# Patient Record
Sex: Male | Born: 1990 | Race: White | Hispanic: No | Marital: Single | Smoking: Never smoker
Health system: Southern US, Community
[De-identification: ages and names within clinical notes are randomized; demographics above are authoritative.]

## PROBLEM LIST (undated history)

## (undated) DIAGNOSIS — I150 Renovascular hypertension: Secondary | ICD-10-CM

## (undated) DIAGNOSIS — Z8249 Family history of ischemic heart disease and other diseases of the circulatory system: Secondary | ICD-10-CM

## (undated) DIAGNOSIS — I773 Arterial fibromuscular dysplasia: Secondary | ICD-10-CM

## (undated) HISTORY — DX: Arterial fibromuscular dysplasia: I77.3

## (undated) HISTORY — DX: Family history of ischemic heart disease and other diseases of the circulatory system: Z82.49

---
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
## 2014-02-11 DIAGNOSIS — I773 Arterial fibromuscular dysplasia: Secondary | ICD-10-CM

## 2014-02-11 HISTORY — DX: Arterial fibromuscular dysplasia: I77.3

## 2014-03-05 ENCOUNTER — Encounter: Payer: Self-pay | Admitting: Cardiology

## 2014-03-05 ENCOUNTER — Ambulatory Visit (INDEPENDENT_AMBULATORY_CARE_PROVIDER_SITE_OTHER): Payer: 59 | Admitting: Cardiology

## 2014-03-05 VITALS — BP 182/90 | HR 70 | Ht 70.0 in | Wt 182.3 lb

## 2014-03-05 DIAGNOSIS — R9431 Abnormal electrocardiogram [ECG] [EKG]: Secondary | ICD-10-CM

## 2014-03-05 DIAGNOSIS — I10 Essential (primary) hypertension: Secondary | ICD-10-CM

## 2014-03-05 MED ORDER — HYDRALAZINE HCL 25 MG PO TABS: 25.0000 mg | ORAL_TABLET | Freq: Two times a day (BID) | ORAL | Status: DC

## 2014-03-05 NOTE — Progress Notes (Signed)
PATIENT: William Gilmore MRN: [id number] DOB: 12/11/1990 PCP: Pearson GrippeKIM, JAMES, MD  Clinic Note: Chief Complaint  Patient presents with  . New Evaluation    no chest pain ,no sob ,no edema, for 2-3 years problem with blood pressure  . Hypertension    stopped  2 weeks ago medication were not helping bloodpressure    HPI: William Gilmore is a 23 y.o. male with a PMH below who presents today for evaluation of difficult control hypertension in a young man. He is the son of one of my long-term patient's, William Gilmore who has multivessel CAD status post CABG. Is also morbidly obese but is not very well losing weight.. Apparently is not a problem her blood pressure last 2 years.  Interval History: William Gilmore presents somewhat frustrated with his lack of blood pressure control from his primary care provider. He has been on several different medications but has not had any good control. He recently just stopped all his medicines because he is starting to have problems with decreased libido/inability to perform with erection. He felt somewhat lethargic and tired oral medications, now feels much better. Apparently this is similar situation occurred with his father during his early years and it very difficult to control blood pressure early age. If any sensation of chest tightness or pressure no headaches or blurred vision her TIA/60. No rapid or irregular heartbeats except for occasional rare palpitations. No lightheadedness or dizziness or wooziness, syncope/near-syncope her TIA/amaurosis fugax symptoms. He does drink a significant caffeine but he knows what he thinks that his blood pressure will go high.   Past Medical History  Diagnosis Date  . Hypertension     Prior Cardiac Evaluation and Past Surgical History: History reviewed. No pertinent past surgical history.  No Known Allergies  Current Outpatient Prescriptions -- as prescribed, but is not taking any of them.   Medication Sig Dispense Refill  .  amLODipine (NORVASC) 5 MG tablet Take 5 mg by mouth daily.      . hydrochlorothiazide (HYDRODIURIL) 25 MG tablet Take 25 mg by mouth daily.      Marland Kitchen. losartan (COZAAR) 100 MG tablet Take 100 mg by mouth daily.       No current facility-administered medications for this visit.    History   Social History Narrative   He is a single man who is in a long-term relationship. 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    He drinks maybe 5 beers a week. He likes to work out doing weightlifting, sprinting and otherwise running. It is about 1 hour weights and 30 minutes with sprinting and the other running laps. He works out about 3-5days week.    family history includes Cancer in his paternal grandmother; Diabetes in his paternal grandfather; Heart disease in his father and maternal grandfather; Hypertension in his father.  ROS: A comprehensive Review of Systems - Negative except As noted in history of present illness.  PHYSICAL EXAM BP 182/90  Pulse 70  Ht 5\' 10"  (1.778 m)  Wt 182 lb 4.8 oz (82.691 kg)  BMI 26.16 kg/m2 General appearance: alert, cooperative, appears stated age, no distress and Well-nourished and well-groomed. Normal mood and affect HEENT: [place]/AT, EOMI, MMM, anicteric sclera Neck: no adenopathy, no carotid bruit, no JVD, supple, symmetrical, trachea midline and thyroid not enlarged, symmetric, no tenderness/mass/nodules Lungs: clear to auscultation bilaterally, normal percussion bilaterally and Nonlabored, good air movement Heart: regular rate and rhythm, S1, S2 normal,  no murmur, click, rub or gallop and normal apical impulse Abdomen: soft, non-tender; bowel sounds normal; no masses,  no organomegaly Extremities: extremities normal, atraumatic, no cyanosis or edema Pulses: 2+ and symmetric Neurologic: Alert and oriented X 3, normal strength and tone. Normal symmetric reflexes. Normal coordination and gait   Adult ECG  Report  Rate: 70 ;  Rhythm: normal sinus rhythm with PACs, short PR interval. High voltage but normal for age; normal EKG  Recent Labs: No labs  ASSESSMENT / PLAN: Very pleasant young man with a family history of CAD and hypertension who now is being troubled with significant hypertension. He is otherwise healthy without major problems. On no medications his blood pressures are in the 180s/90s.  It is actually somewhat to his back he is not currently on any medications because they can inject laughter potential secondary hypertension.  Severe uncontrolled hypertension unsure if essential versus secondary In a young man with no other reasons to have hypertension besides his father's history, warrants evaluation for secondary hypertension.  Plan: Will order Dopplers to evaluate for renovascular hypertension (FMD), 2-D echocardiogram as he does have PAC just impingeds to ensure no early signs of hypertensive heart disease and also for the possibility of coarctation., Will check TSH, urine and plasma metanephrines, CMP as well as renal and aldosterone levels.   For now we'll leave off medications until labs are drawn. I will however start hydralazine 25 twice a day in the interim   Treating his blood pressure will be problematic because the regimen he is currently on has made him feel quite lethargic and decreased libido. Would need to be careful with beta blockers. Hopefully the effect will not be long-term and he'll be able to be happy with a gradual reduction in blood pressure.  Abnormal EKG Probably due to age but, but there is relatively high voltage. 1 to ensure he does not have LVH already.   Orders Placed This Encounter  Procedures  . Aldosterone + renin activity w/ ratio  . Metanephrines, Urine, 24 hour  . Metanephrines, plasma  . COMPLETE METABOLIC PANEL WITH GFR  . TSH  . EKG 12-Lead  . 2D Echocardiogram without contrast    ABN EKG, HYPERTENSION    Standing Status: Future      Number of Occurrences:      Standing Expiration Date: 03/05/2015    Order Specific Question:  Type of Echo    Answer:  Complete    Order Specific Question:  Where should this test be performed    Answer:  [facility]    Order Specific Question:  Reason for exam-Echo    Answer:  Other - See Comments Section    Order Specific Question:  Reason for exam-Echo    Answer:  Other - See Comments Section  . Renal Artery Duplex Bilateral    Standing Status: Future     Number of Occurrences:      Standing Expiration Date: 03/05/2015    Order Specific Question:  Laterality    Answer:  Bilateral    Order Specific Question:  Where should this test be performed:    Answer:  [facility]   Meds ordered this encounter  Medications  . hydrALAZINE (APRESOLINE) 25 MG tablet    Sig: Take 1 tablet (25 mg total) by mouth 2 (two) times daily.    Dispense:  60 tablet    Refill:  6    Followup: 1-3 months  DAVID W. Herbie Baltimore, M.D., M.S. Interventional  Cardiolgy [facility]

## 2014-03-05 NOTE — Patient Instructions (Addendum)
Your physician has requested that you have a renal artery duplex. During this test, an ultrasound is used to evaluate blood flow to the kidneys. Allow one hour for this exam. Do not eat after midnight the day before and avoid carbonated beverages. Take your medications as you usually do.  PLEASE  GET LAB SLIP WHEN YOU GET TEST DONE.  Your physician has requested that you have an echocardiogram. Echocardiography is a painless test that uses sound waves to create images of your heart. It provides your doctor with information about the size and shape of your heart and how well your heart's chambers and valves are working. This procedure takes approximately one hour. There are no restrictions for this procedure.   Your physician wants you to follow-up in  1-3  MONTHS Dr Harding.---30 min appointment. Patient needs appointment before he returns to college. You will receive a reminder letter in the mail two months in advance. If you don't receive a letter, please call our office to schedule the follow-up appointment.

## 2014-03-06 ENCOUNTER — Telehealth (HOSPITAL_COMMUNITY): Payer: Self-pay | Admitting: *Deleted

## 2014-03-07 ENCOUNTER — Encounter: Payer: Self-pay | Admitting: Cardiology

## 2014-03-07 ENCOUNTER — Telehealth (HOSPITAL_COMMUNITY): Payer: Self-pay | Admitting: *Deleted

## 2014-03-07 DIAGNOSIS — R9431 Abnormal electrocardiogram [ECG] [EKG]: Secondary | ICD-10-CM | POA: Insufficient documentation

## 2014-03-07 NOTE — Assessment & Plan Note (Addendum)
In a young man with no other reasons to have hypertension besides his father's history, warrants evaluation for secondary hypertension.  Plan: Will order Dopplers to evaluate for renovascular hypertension (FMD), 2-D echocardiogram as he does have PAC just impingeds to ensure no early signs of hypertensive heart disease and also for the possibility of coarctation., Will check TSH, urine and plasma metanephrines, CMP as well as renal and aldosterone levels.   For now we'll leave off medications until labs are drawn. I will however start hydralazine 25 twice a day in the interim   Treating his blood pressure will be problematic because the regimen he is currently on has made him feel quite lethargic and decreased libido. Would need to be careful with beta blockers. Hopefully the effect will not be long-term and he'll be able to be happy with a gradual reduction in blood pressure.

## 2014-03-07 NOTE — Assessment & Plan Note (Signed)
Probably due to age but, but there is relatively high voltage. 1 to ensure he does not have LVH already.

## 2014-03-08 ENCOUNTER — Telehealth (HOSPITAL_COMMUNITY): Payer: Self-pay | Admitting: *Deleted

## 2014-03-11 ENCOUNTER — Ambulatory Visit (HOSPITAL_BASED_OUTPATIENT_CLINIC_OR_DEPARTMENT_OTHER)
Admission: RE | Admit: 2014-03-11 | Discharge: 2014-03-11 | Disposition: A | Discharge status: Home / Self Care | Payer: 59 | Source: Ambulatory Visit | Attending: Cardiology | Admitting: Cardiology

## 2014-03-11 ENCOUNTER — Ambulatory Visit (HOSPITAL_COMMUNITY)
Admission: RE | Admit: 2014-03-11 | Discharge: 2014-03-11 | Disposition: A | Discharge status: Home / Self Care | Payer: 59 | Source: Ambulatory Visit | Attending: Cardiology | Admitting: Cardiology

## 2014-03-11 DIAGNOSIS — R9431 Abnormal electrocardiogram [ECG] [EKG]: Secondary | ICD-10-CM | POA: Insufficient documentation

## 2014-03-11 DIAGNOSIS — I119 Hypertensive heart disease without heart failure: Secondary | ICD-10-CM

## 2014-03-11 DIAGNOSIS — I10 Essential (primary) hypertension: Secondary | ICD-10-CM | POA: Insufficient documentation

## 2014-03-11 NOTE — Progress Notes (Signed)
2D Echocardiogram Complete.  03/11/2014   Bethany McMahill, RDCS  

## 2014-03-11 NOTE — Progress Notes (Signed)
Renal Duplex Completed. Rita Sturdivant, BS, RDMS, RVT  

## 2014-03-12 ENCOUNTER — Telehealth: Payer: Self-pay | Admitting: *Deleted

## 2014-03-12 LAB — COMPLETE METABOLIC PANEL WITH GFR
ALT: 14 U/L (ref 0–53)
AST: 19 U/L (ref 0–37)
Albumin: 4.9 g/dL (ref 3.5–5.2)
Alkaline Phosphatase: 80 U/L (ref 39–117)
BUN: 14 mg/dL (ref 6–23)
CALCIUM: 9.3 mg/dL (ref 8.4–10.5)
CHLORIDE: 103 meq/L (ref 96–112)
CO2: 28 mEq/L (ref 19–32)
Creat: 1.1 mg/dL (ref 0.50–1.35)
GFR, Est African American: >89 mL/min
GFR, Est Non African American: >89 mL/min
Glucose, Bld: 91 mg/dL (ref 70–99)
Potassium: 4.2 mEq/L (ref 3.5–5.3)
SODIUM: 140 meq/L (ref 135–145)
TOTAL PROTEIN: 7.2 g/dL (ref 6.0–8.3)
Total Bilirubin: 0.9 mg/dL (ref 0.2–1.2)

## 2014-03-12 LAB — TSH: TSH: 1.726 u[IU]/mL (ref 0.350–4.500)

## 2014-03-12 NOTE — Progress Notes (Signed)
Quick Note:  Echo results: Good news: Essentially normal echocardiogram and normal pump function and normal valve function. No signs to suggest heart attack.. EF: 60-65%. No regional wall motion abnormalities.  Marykay LexHARDING,DAVID W, M.D., M.S. Interventional Cardiologist        ______

## 2014-03-12 NOTE — Telephone Encounter (Signed)
CALLED NO ANSWER WILL CALL TOMORROW

## 2014-03-12 NOTE — Progress Notes (Signed)
Quick Note:  Echo results: Good news: Essentially normal echocardiogram and normal pump function and normal valve function. No signs to suggest heart attack.. EF: 55-60%. No regional wall motion abnormalities   ______ 

## 2014-03-12 NOTE — Telephone Encounter (Signed)
Left message to call back , concerning echo results

## 2014-03-12 NOTE — Telephone Encounter (Signed)
Message copied by Tobin ChadMARTIN, SHARON V. on Tue Mar 12, 2014  2:17 PM ------      Message from: Marykay LexHARDING, DAVID W      Created: Tue Mar 12, 2014  6:56 AM       Echo results:      Good news: Essentially normal echocardiogram and normal pump function and normal valve function.  No signs to suggest heart attack..      EF: 55-60%.      No regional wall motion abnormalities             ------

## 2014-03-12 NOTE — Telephone Encounter (Signed)
Returning your call. °

## 2014-03-13 ENCOUNTER — Other Ambulatory Visit: Payer: Self-pay | Admitting: Cardiology

## 2014-03-13 ENCOUNTER — Telehealth: Payer: Self-pay | Admitting: *Deleted

## 2014-03-13 NOTE — Telephone Encounter (Signed)
Message copied by Tobin ChadMARTIN, SHARON V. on Wed Mar 13, 2014  9:11 AM ------      Message from: Marykay LexHARDING, DAVID W      Created: Tue Mar 12, 2014  7:00 PM       The Kidney artery doppler study may be our smoking gun -- there is evidence of possible narrowings in the artery to the Right Kidney that is consistent with a condition called Fibromuscular Dysplasia -- one of the possible causes of Secondary High Blood Pressure.            I will be restarting some blood pressure medications when I see you the patient back.  Will also refer to my partner - Dr. Allyson SabalBerry to see if this warrants doing invasive evaluation with Kidney artery angiography (like a heart catheterization, but focusing on kidney arteries).  Will discuss this in follow-up.            Marykay LexHARDING,DAVID W, MD       ------

## 2014-03-13 NOTE — Telephone Encounter (Signed)
Spoke to patient. Result given . Verbalized understanding Will go into more detail at appointment with Dr Herbie BaltimoreHarding. Release in  My Chart

## 2014-03-13 NOTE — Telephone Encounter (Signed)
Spoke to patient. Result given . Verbalized understanding  

## 2014-03-13 NOTE — Progress Notes (Signed)
I'd be happy to see and evaluate for Renal Artery FMD Theodoro Gristave.  JJB

## 2014-03-14 LAB — METANEPHRINES, PLASMA
Metanephrine, Free: 46 pg/mL (ref <=57)
Normetanephrine, Free: 102 pg/mL (ref <=148)
TOTAL METANEPHRINES-PLASMA: 148 pg/mL (ref <=205)

## 2014-03-15 LAB — ALDOSTERONE + RENIN ACTIVITY W/ RATIO
ALDO / PRA Ratio: 3 Ratio (ref 0.9–28.9)
Aldosterone: 6 ng/dL
PRA LC/MS/MS: 2.01 ng/mL/h (ref 0.25–5.82)

## 2014-03-17 LAB — METANEPHRINES, URINE, 24 HOUR
Metaneph Total, Ur: 303 mcg/24 h (ref 94–604)
Metanephrines, Ur: 141 mcg/24 h (ref 25–222)
Normetanephrine, 24H Ur: 162 mcg/24 h (ref 40–412)

## 2014-03-20 ENCOUNTER — Encounter: Payer: Self-pay | Admitting: Cardiology

## 2014-03-20 ENCOUNTER — Ambulatory Visit (INDEPENDENT_AMBULATORY_CARE_PROVIDER_SITE_OTHER): Payer: 59 | Admitting: Cardiology

## 2014-03-20 VITALS — BP 150/86 | HR 68 | Ht 70.5 in | Wt 181.5 lb

## 2014-03-20 DIAGNOSIS — I7789 Other specified disorders of arteries and arterioles: Secondary | ICD-10-CM

## 2014-03-20 DIAGNOSIS — I773 Arterial fibromuscular dysplasia: Secondary | ICD-10-CM

## 2014-03-20 DIAGNOSIS — I10 Essential (primary) hypertension: Secondary | ICD-10-CM

## 2014-03-20 NOTE — Patient Instructions (Signed)
You have been referred to see Dr Allyson SabalBerry - discuss FMD-  APPT.  Apr 15 2014 2:45 PM  After you take the MCAT , stop the hydralazine and restart Amlodopine  Have blood check ; call with results  2-3 months  Your physician wants you to follow-up in  DEC 2015. You will receive a reminder letter in the mail two months in advance. If you don't receive a letter, please call our office to schedule the follow-up appointment.

## 2014-03-21 ENCOUNTER — Encounter: Payer: Self-pay | Admitting: Cardiology

## 2014-03-21 NOTE — Progress Notes (Signed)
PATIENT: William Gilmore MRN: [id number] DOB: 12/16/1990 PCP: Pearson GrippeKIM, JAMES, MD  Clinic Note: Chief Complaint  Patient presents with  . Follow-up    RENAL DOPPLER, LABS, ECHO    HPI: William ProSteven Barradas is a 23 y.o. male with a PMH below who presents today for followup evaluation of difficult control hypertension in a young man. He is the son of one of my long-term patient's, Gerlene BurdockRichard who has multivessel CAD status post CABG. I saw Jeannett SeniorStephen on June 25 for relatively asymptomatic hypertension but which is very difficult control. He was most noted in intolerance to high dose of medications for blood pressure control and was feeling confused and frustrated with lack of blood pressure control despite multiple medications. I took him off of the walker and ARB that he was on as well as the HCTZ at multiple labs including serum and urine metanephrines, thyroid studies, basic chemistries, renin/angiotensin levels. All of these laboratories were normal. I also order renal artery Dopplers to evaluate for possible renal artery stenosis which revealed possible fibromuscular dysplasia in the Right Renal Artery.  I placed him on low-dose hydralazine while waiting for lab work to be done, this has kept his blood pressure mostly in the 150-180 mmHg range.  Interval History: He does feel mostly better while not being on high dose of medications. Less problems with libido and fatigue. He still denies any symptoms of hypertension including blurred vision, headaches, TIA/amaurosis fugax symptoms. No seizure-like activity. No syncope or near-syncope. No chest tightness or pressure with rest or exertion. No exertional dyspnea. No PND, orthopnea or edema. No rapid or irregular heartbeat/palpitations or arrhythmias.  Past Medical History  Diagnosis Date  . Hypertension   . Renal fibromuscular dysplasia 02/2014    RA Dopplers to evaluate premature HTN: Right mid renal artery: Increased velocities with turbulent flow consistent  with fibromuscular dysplasia. Left renal artery normal bilateral kidneys of normal size shape and contour. Celiac Artery and SMA widely patent.  . Family history of premature CAD     Father: Severe MV CAD --> CABG    Prior Cardiac Evaluation and Past Surgical History: History reviewed. No pertinent past surgical history.  No Known Allergies  Previous Outpatient Prescriptions -- as prescribed, but is not taking any of them.  Currently taking hydralazine 25 mg twice a day until converting back to amlodipine and eventually ARB   Medication Sig Dispense Refill  . amLODipine (NORVASC) 5 MG tablet Take 5 mg by mouth daily.      . hydrochlorothiazide (HYDRODIURIL) 25 MG tablet Take 25 mg by mouth daily.      Marland Kitchen. losartan (COZAAR) 100 MG tablet Take 100 mg by mouth daily.       No current facility-administered medications for this visit.    History   Social History Narrative   He is a single man who is in a long-term relationship. 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    He drinks maybe 5 beers a week. He likes to work out doing weightlifting, sprinting and otherwise running. It is about 1 hour weights and 30 minutes with sprinting and the other running laps. He works out about 3-5days week.    family history includes Cancer in his paternal grandmother; Diabetes in his paternal grandfather; Heart disease in his father and maternal grandfather; Hypertension in his father.  ROS: A comprehensive Review of Systems - Negative except As noted in history of present illness. no  melena, hematochezia or hematuria. No epistaxis. No blurred vision  PHYSICAL EXAM BP 150/86  Pulse 68  Ht 5' 10.5" (1.791 m)  Wt 181 lb 8 oz (82.328 kg)  BMI 25.67 kg/m2 General appearance: alert, cooperative, appears stated age, no distress and Well-nourished and well-groomed. Normal mood and affect HEENT: [place]/AT, EOMI, MMM, anicteric sclera Neck: no adenopathy,  no carotid bruit, no JVD, supple, symmetrical, trachea midline and thyroid not enlarged, symmetric, no tenderness/mass/nodules Lungs: clear to auscultation bilaterally, normal percussion bilaterally and Nonlabored, good air movement Heart: regular rate and rhythm, S1, S2 normal, no murmur, click, rub or gallop and normal apical impulse Abdomen: soft, non-tender; bowel sounds normal; no masses,  no organomegaly Extremities: extremities normal, atraumatic, no cyanosis or edema Pulses: 2+ and symmetric Neurologic: Alert and oriented X 3, normal strength and tone. Normal symmetric reflexes. Normal coordination and gait  Recent Labs: Reviewed in Epic. Mostly normal  ASSESSMENT / PLAN: Very pleasant young man with early onset hypertension that is most likely secondary to space on his age and otherwise normal health. Most lab work was relatively normal however his renal artery Dopplers suggested fibromuscular dysplasia of the right renal artery. We're in a tiny collection because he is going back to school in a couple weeks. He then will be back for a couple weeks before starting the entire semester. He would like to have something done prior to this his leaving. I think the most prudent course of action is to refer him to Dr. Nanetta Batty to determine if there is a potential place for PTA of the right renal artery.  For now I meant to increase his medical regimen by restarting the calcium channel blocker at 5 mg daily with plans to increase to 10 mg. Intention would be to have a gradual reduction in blood pressure to avoid the side effects of being on multiple medications. For the reasons of his fatigue and decreased libido, I would not want to use beta blockers. Exam did not have much symptoms with hydralazine which leaves the possibility albeit not as beneficial as an ARB or ACE inhibitor. With the concern of possible renovascular hypertension and the possibility of PTA renal artery I will avoid ARB for  now.  I will see him back in his winter break if not earlier. I recommended that he has his blood pressure evaluated at student health at [facility]. I think it's probably best for him to continue with his current medical therapy until he takes the MCAT test to avoid changing his mental state prior to taking this important test.    Renal fibromuscular dysplasia - possible renovascular hypertension Referred to Dr. Nanetta Batty as noted above for evaluation of possible PTA of renal artery  Severe uncontrolled hypertension unsure if essential versus secondary All the labs looked normal. Evaluating for renal vascular hypertension. Will restart amlodipine after taking the MCAT, and then gradually add back ARB once we know what is going to done with potential PTA renal artery. Plan gradual titration of antihypertensive regimen.    No orders of the defined types were placed in this encounter.   Close to 30 minutes was spent discussing the results of the study and potential treatment options including renal artery PTA. There've any questions about this procedure with details discussed. We also discussed the plan for gradual treatment. Part of the time was also involved with reassurance.  Followup: During December break; scheduled to see Dr. Allyson Sabal on August 3 at 2:45 PM  DAVID W.  Herbie Baltimore, M.D., M.S. Interventional Cardiolgy [facility]

## 2014-03-21 NOTE — Assessment & Plan Note (Signed)
Referred to Dr. Nanetta BattyJonathan Berry as noted above for evaluation of possible PTA of renal artery

## 2014-03-21 NOTE — Assessment & Plan Note (Signed)
All the labs looked normal. Evaluating for renal vascular hypertension. Will restart amlodipine after taking the MCAT, and then gradually add back ARB once we know what is going to done with potential PTA renal artery. Plan gradual titration of antihypertensive regimen.

## 2014-03-25 ENCOUNTER — Ambulatory Visit: Payer: 59 | Admitting: Cardiology

## 2014-03-25 NOTE — Progress Notes (Signed)
I'd be happy to see him William Gilmore. The right renal may indeed have FMD  JJB

## 2014-04-15 ENCOUNTER — Encounter: Payer: Self-pay | Admitting: Cardiovascular Disease

## 2014-04-15 ENCOUNTER — Ambulatory Visit (INDEPENDENT_AMBULATORY_CARE_PROVIDER_SITE_OTHER): Payer: 59 | Admitting: Cardiovascular Disease

## 2014-04-15 VITALS — BP 168/101 | HR 71 | Ht 71.0 in | Wt 186.0 lb

## 2014-04-15 DIAGNOSIS — I7789 Other specified disorders of arteries and arterioles: Secondary | ICD-10-CM

## 2014-04-15 DIAGNOSIS — R0989 Other specified symptoms and signs involving the circulatory and respiratory systems: Secondary | ICD-10-CM

## 2014-04-15 DIAGNOSIS — I773 Arterial fibromuscular dysplasia: Secondary | ICD-10-CM

## 2014-04-15 NOTE — Progress Notes (Signed)
04/15/2014 William Gilmore   [birth year]  [id number]  Primary Physician Pearson Grippe, MD Primary Cardiologist: Runell Gess MD Roseanne Reno   HPI:  William Gilmore is a delightful 23 year old fit-appearing single Caucasian male patient of Dr. Alexis Goodell was referred to me because of renal vascular hypertension. He's been treated medically for several years. Recently his blood pressure has been more difficult to control. 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 Otherwise his past medical history is benign. Does have a family history of premature coronary artery disease.   Current Outpatient Prescriptions  Medication Sig Dispense Refill  . amLODipine (NORVASC) 5 MG tablet Take 5 mg by mouth daily.       No current facility-administered medications for this visit.    No Known Allergies  History   Social History  . Marital Status: Unknown    Spouse Name: N/A    Number of Children: N/A  . Years of Education: N/A   Occupational History  . Not on file.   Social History Main Topics  . Smoking status: Never Smoker   . Smokeless tobacco: Not on file  . Alcohol Use: 3.0 oz/week    5 Cans of beer per week  . Drug Use: No  . Sexual Activity: Not on file   Other Topics Concern  . Not on file   Social History Narrative   He is a single man who is in a long-term relationship. 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    He drinks maybe 5 beers a week. He likes to work out doing weightlifting, sprinting and otherwise running. It is about 1 hour weights and 30 minutes with sprinting and the other running laps. He works out about 3-5days week.     Review of Systems: General: negative for chills, fever, night sweats or weight changes.  Cardiovascular: negative for chest pain, dyspnea on exertion, edema, orthopnea, palpitations, paroxysmal nocturnal dyspnea or shortness of  breath Dermatological: negative for rash Respiratory: negative for cough or wheezing Urologic: negative for hematuria Abdominal: negative for nausea, vomiting, diarrhea, bright red blood per rectum, melena, or hematemesis Neurologic: negative for visual changes, syncope, or dizziness All other systems reviewed and are otherwise negative except as noted above.    Blood pressure 168/101, pulse 71, height 5\' 11"  (1.803 m), weight 186 lb (84.369 kg).  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 Herbie Baltimore for evaluation and treatment of presumed renal vascular hypertension secondary to fibromuscular dysplasia. He has a long history of hypertension dating back years but this has been more noticeable over the last several years and she's been in college. His tone multiple antihypertensive medications who was poor results. Recent renal Doppler studies performed in our office 03/11/14 suggested right renal artery FMD. He is on amlodipine with poorly controlled blood pressures. I'm going to get a CT and exam of his renal arteries to confirm the finding and if indeed he does have renal artery FMD will arrange for him to undergo anoplasty at his  earliest convenience. I have thoroughly discussed the risks and benefits.      Runell GessJonathan J. Waniya Hoglund MD FACP,FACC,FAHA, [facility] 04/15/2014 3:22 PM

## 2014-04-15 NOTE — Patient Instructions (Signed)
  We will see you back in follow up in December 2015 as a peripheral angiogram workup  Dr Allyson SabalBerry has ordered: 1. CT angio of your abdomen and pelvis to look at the right renal artery.   2. Carotid Duplex- This test is an ultrasound of the carotid arteries in your neck. It looks at blood flow through these arteries that supply the brain with blood. Allow one hour for this exam. There are no restrictions or special instructions.  3. Renal Angiogram

## 2014-04-15 NOTE — Assessment & Plan Note (Signed)
The patient was referred to me by Dr. Herbie BaltimoreHarding for evaluation and treatment of presumed renal vascular hypertension secondary to fibromuscular dysplasia. He has a long history of hypertension dating back years but this has been more noticeable over the last several years and she's been in college. His tone multiple antihypertensive medications who was poor results. Recent renal Doppler studies performed in our office 03/11/14 suggested right renal artery FMD. He is on amlodipine with poorly controlled blood pressures. I'm going to get a CT and exam of his renal arteries to confirm the finding and if indeed he does have renal artery FMD will arrange for him to undergo anoplasty at his earliest convenience. I have thoroughly discussed the risks and benefits.

## 2014-04-17 ENCOUNTER — Ambulatory Visit (INDEPENDENT_AMBULATORY_CARE_PROVIDER_SITE_OTHER)
Admission: RE | Admit: 2014-04-17 | Discharge: 2014-04-17 | Disposition: A | Discharge status: Home / Self Care | Payer: 59 | Source: Ambulatory Visit | Attending: Cardiovascular Disease | Admitting: Cardiovascular Disease

## 2014-04-17 DIAGNOSIS — I773 Arterial fibromuscular dysplasia: Secondary | ICD-10-CM

## 2014-04-17 DIAGNOSIS — I7789 Other specified disorders of arteries and arterioles: Secondary | ICD-10-CM

## 2014-04-17 MED ORDER — IOHEXOL 350 MG/ML SOLN: 100.0000 mL | Freq: Once | INTRAVENOUS | Status: AC | PRN

## 2014-04-22 ENCOUNTER — Ambulatory Visit (HOSPITAL_COMMUNITY)
Admission: RE | Admit: 2014-04-22 | Discharge: 2014-04-22 | Disposition: A | Discharge status: Home / Self Care | Payer: 59 | Source: Ambulatory Visit | Attending: Cardiovascular Disease | Admitting: Cardiovascular Disease

## 2014-04-22 DIAGNOSIS — R0989 Other specified symptoms and signs involving the circulatory and respiratory systems: Secondary | ICD-10-CM | POA: Diagnosis not present

## 2014-04-22 NOTE — Progress Notes (Signed)
Carotid Duplex Completed. William Gilmore, BS, RDMS, RVT  

## 2014-08-27 ENCOUNTER — Ambulatory Visit
Admission: RE | Admit: 2014-08-27 | Discharge: 2014-08-27 | Disposition: A | Discharge status: Home / Self Care | Payer: 59 | Source: Ambulatory Visit | Attending: Cardiovascular Disease | Admitting: Cardiovascular Disease

## 2014-08-27 ENCOUNTER — Ambulatory Visit (INDEPENDENT_AMBULATORY_CARE_PROVIDER_SITE_OTHER): Payer: 59 | Admitting: Cardiovascular Disease

## 2014-08-27 ENCOUNTER — Encounter: Payer: Self-pay | Admitting: Cardiovascular Disease

## 2014-08-27 VITALS — BP 154/100 | HR 77 | Ht 70.0 in | Wt 171.9 lb

## 2014-08-27 DIAGNOSIS — I773 Arterial fibromuscular dysplasia: Secondary | ICD-10-CM

## 2014-08-27 DIAGNOSIS — I129 Hypertensive chronic kidney disease with stage 1 through stage 4 chronic kidney disease, or unspecified chronic kidney disease: Secondary | ICD-10-CM

## 2014-08-27 DIAGNOSIS — D689 Coagulation defect, unspecified: Secondary | ICD-10-CM

## 2014-08-27 DIAGNOSIS — Z01818 Encounter for other preprocedural examination: Secondary | ICD-10-CM

## 2014-08-27 DIAGNOSIS — I7789 Other specified disorders of arteries and arterioles: Secondary | ICD-10-CM

## 2014-08-27 LAB — CBC
HCT: 48.4 % (ref 39.0–52.0)
Hemoglobin: 16.1 g/dL (ref 13.0–17.0)
MCH: 28.8 pg (ref 26.0–34.0)
MCHC: 33.3 g/dL (ref 30.0–36.0)
MCV: 86.4 fL (ref 78.0–100.0)
MPV: 10.9 fL (ref 9.4–12.4)
Platelets: 217 10*3/uL (ref 150–400)
RBC: 5.6 MIL/uL (ref 4.22–5.81)
RDW: 13.4 % (ref 11.5–15.5)
WBC: 7.4 10*3/uL (ref 4.0–10.5)

## 2014-08-27 LAB — BASIC METABOLIC PANEL
BUN: 19 mg/dL (ref 6–23)
CHLORIDE: 98 meq/L (ref 96–112)
CO2: 30 meq/L (ref 19–32)
CREATININE: 1.12 mg/dL (ref 0.50–1.35)
Calcium: 9.9 mg/dL (ref 8.4–10.5)
Glucose, Bld: 83 mg/dL (ref 70–99)
POTASSIUM: 4 meq/L (ref 3.5–5.3)
Sodium: 138 mEq/L (ref 135–145)

## 2014-08-27 LAB — APTT: APTT: 28 s (ref 24–37)

## 2014-08-27 LAB — PROTIME-INR
INR: 1.1 (ref <1.50)
Prothrombin Time: 14.2 seconds (ref 11.6–15.2)

## 2014-08-27 NOTE — Progress Notes (Signed)
William Gilmore is a delightful 23-year-old fit-appearing single Caucasian male patient of Dr. David Hardings was referred to me because of renal vascular hypertension. I last saw him in the office 04/15/14. His recovery by his father today. He is currently applying for medical school.He's been treated medically for several years. Recently his blood pressure has been more difficult to control. 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 Otherwise his past medical history is benign. Does have a family history of premature coronary artery disease. CT angiography performed 04/17/14 suggested fibromuscular dysplasia of the right renal artery. 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 I have thoroughly discussed the risks and benefits with the patient and his father who agree to proceed. We'll schedule this sometime next week.   William Gilmore J. Laekyn Rayos, M.D., FACP, FACC, FAHA, FSCAI [facility] [address] [place], [place]  [address]  [phone] 08/27/2014 10:00 AM   

## 2014-08-27 NOTE — Assessment & Plan Note (Signed)
William CanalesSteve returns today for follow-up. His blood pressures have remained elevated. He stopped taking his amlodipine because apparently this has not improved his blood pressure control. His CT angiogram performed on 04/17/14 did suggest mid right renal artery F M.D. Consistent with his renal Doppler studies that were performed on 03/11/14. We have discussed options including balloon angioplasty which the patient has decided to pursue. I feel we discussed the risks and benefits with the patient and his father who was present during the interview.

## 2014-08-27 NOTE — Patient Instructions (Signed)
Dr. Allyson SabalBerry has ordered a Peripheral Angiogram (Renal Angiogram, Right groin) to [facility].  This procedure is going to look at the bloodflow in your lower extremities.  If Dr. Allyson SabalBerry is able to open up the arteries, you will have to spend one night in the hospital.  If he is not able to open the arteries, you will be able to go home that same day.    After the procedure, you will not be allowed to drive for 3 days or push, pull, or lift anything greater than 10 lbs for one week.    You will be required to have the following tests prior to the procedure:  1. Blood work-the blood work can be done no more than 7 days prior to the procedure.  [facility] can be done at any [facility].  There is one downstairs on the first floor of this building and one in [facility] ([address])  2. Chest Xray-the chest xray order has already been placed at [facility].

## 2014-08-28 ENCOUNTER — Encounter (HOSPITAL_COMMUNITY): Payer: Self-pay | Admitting: Pharmacy Technician

## 2014-08-28 ENCOUNTER — Other Ambulatory Visit: Payer: Self-pay | Admitting: *Deleted

## 2014-08-28 DIAGNOSIS — I129 Hypertensive chronic kidney disease with stage 1 through stage 4 chronic kidney disease, or unspecified chronic kidney disease: Secondary | ICD-10-CM

## 2014-08-28 NOTE — Progress Notes (Signed)
Hospital orders for procedure entered

## 2014-09-02 ENCOUNTER — Encounter (HOSPITAL_COMMUNITY)
Admission: RE | Disposition: A | Discharge status: Home / Self Care | Payer: Self-pay | Source: Ambulatory Visit | Attending: Cardiovascular Disease

## 2014-09-02 ENCOUNTER — Ambulatory Visit (HOSPITAL_COMMUNITY)
Admission: RE | Admit: 2014-09-02 | Discharge: 2014-09-03 | Disposition: A | Discharge status: Home / Self Care | Payer: 59 | Source: Ambulatory Visit | Attending: Cardiovascular Disease | Admitting: Cardiovascular Disease

## 2014-09-02 ENCOUNTER — Encounter (HOSPITAL_COMMUNITY): Payer: Self-pay | Admitting: General Practice

## 2014-09-02 DIAGNOSIS — Z8249 Family history of ischemic heart disease and other diseases of the circulatory system: Secondary | ICD-10-CM | POA: Insufficient documentation

## 2014-09-02 DIAGNOSIS — I150 Renovascular hypertension: Secondary | ICD-10-CM | POA: Diagnosis present

## 2014-09-02 DIAGNOSIS — I773 Arterial fibromuscular dysplasia: Secondary | ICD-10-CM | POA: Diagnosis not present

## 2014-09-02 DIAGNOSIS — I129 Hypertensive chronic kidney disease with stage 1 through stage 4 chronic kidney disease, or unspecified chronic kidney disease: Secondary | ICD-10-CM

## 2014-09-02 DIAGNOSIS — I701 Atherosclerosis of renal artery: Secondary | ICD-10-CM | POA: Insufficient documentation

## 2014-09-02 HISTORY — PX: RENAL ANGIOGRAM: SHX5509

## 2014-09-02 HISTORY — PX: RENAL ARTERY ANGIOPLASTY: SHX2317

## 2014-09-02 HISTORY — PX: CARDIAC CATHETERIZATION: SHX172

## 2014-09-02 HISTORY — DX: Renovascular hypertension: I15.0

## 2014-09-02 LAB — POCT ACTIVATED CLOTTING TIME: ACTIVATED CLOTTING TIME: 171 s

## 2014-09-02 SURGERY — RENAL ANGIOGRAM
Anesthesia: LOCAL | Laterality: Right

## 2014-09-02 MED ORDER — LIDOCAINE HCL (PF) 1 % IJ SOLN
INTRAMUSCULAR | Status: AC
  Filled 2014-09-02: qty 30

## 2014-09-02 MED ORDER — ONDANSETRON HCL 4 MG/2ML IJ SOLN
INTRAMUSCULAR | Status: AC
  Administered 2014-09-02: 4 mg via INTRAVENOUS
  Filled 2014-09-02: qty 2

## 2014-09-02 MED ORDER — MIDAZOLAM HCL 2 MG/2ML IJ SOLN
INTRAMUSCULAR | Status: AC
  Filled 2014-09-02: qty 2

## 2014-09-02 MED ORDER — HEPARIN (PORCINE) IN NACL 2-0.9 UNIT/ML-% IJ SOLN
INTRAMUSCULAR | Status: AC
  Filled 2014-09-02: qty 500

## 2014-09-02 MED ORDER — ATROPINE SULFATE 0.1 MG/ML IJ SOLN
INTRAMUSCULAR | Status: AC
  Filled 2014-09-02: qty 10

## 2014-09-02 MED ORDER — MORPHINE SULFATE 2 MG/ML IJ SOLN: 2.0000 mg | INTRAMUSCULAR | Status: DC | PRN

## 2014-09-02 MED ORDER — ONDANSETRON HCL 4 MG/2ML IJ SOLN
4.0000 mg | Freq: Four times a day (QID) | INTRAMUSCULAR | Status: DC | PRN
  Administered 2014-09-02: 4 mg via INTRAVENOUS

## 2014-09-02 MED ORDER — ACETAMINOPHEN 325 MG PO TABS: 650.0000 mg | ORAL_TABLET | ORAL | Status: DC | PRN

## 2014-09-02 MED ORDER — ASPIRIN 81 MG PO CHEW
81.0000 mg | CHEWABLE_TABLET | ORAL | Status: AC
  Administered 2014-09-02: 81 mg via ORAL

## 2014-09-02 MED ORDER — HEPARIN SODIUM (PORCINE) 1000 UNIT/ML IJ SOLN
INTRAMUSCULAR | Status: AC
  Filled 2014-09-02: qty 1

## 2014-09-02 MED ORDER — ASPIRIN EC 325 MG PO TBEC
325.0000 mg | DELAYED_RELEASE_TABLET | Freq: Every day | ORAL | Status: DC
  Administered 2014-09-03: 09:00:00 325 mg via ORAL
  Filled 2014-09-02: qty 1

## 2014-09-02 MED ORDER — ASPIRIN 81 MG PO CHEW
CHEWABLE_TABLET | ORAL | Status: AC
  Filled 2014-09-02: qty 1

## 2014-09-02 MED ORDER — ONDANSETRON HCL 4 MG/2ML IJ SOLN
INTRAMUSCULAR | Status: AC
  Filled 2014-09-02: qty 2

## 2014-09-02 MED ORDER — INFLUENZA VAC SPLIT QUAD 0.5 ML IM SUSY
0.5000 mL | PREFILLED_SYRINGE | Freq: Once | INTRAMUSCULAR | Status: AC
  Administered 2014-09-03: 10:00:00 0.5 mL via INTRAMUSCULAR
  Filled 2014-09-02: qty 0.5

## 2014-09-02 MED ORDER — SODIUM CHLORIDE 0.9 % IV SOLN
INTRAVENOUS | Status: AC
  Administered 2014-09-02: 19:00:00 via INTRAVENOUS

## 2014-09-02 MED ORDER — AMLODIPINE BESYLATE 5 MG PO TABS
5.0000 mg | ORAL_TABLET | Freq: Every day | ORAL | Status: DC
  Administered 2014-09-02: 19:00:00 5 mg via ORAL
  Filled 2014-09-02 (×3): qty 1

## 2014-09-02 MED ORDER — SODIUM CHLORIDE 0.9 % IJ SOLN: 3.0000 mL | INTRAMUSCULAR | Status: DC | PRN

## 2014-09-02 MED ORDER — SODIUM CHLORIDE 0.9 % IV SOLN
INTRAVENOUS | Status: DC
  Administered 2014-09-02: 06:00:00 via INTRAVENOUS

## 2014-09-02 MED ORDER — FENTANYL CITRATE 0.05 MG/ML IJ SOLN
INTRAMUSCULAR | Status: AC
  Filled 2014-09-02: qty 2

## 2014-09-02 NOTE — H&P (View-Only) (Signed)
William Gilmore is a delightful 23 year old fit-appearing single Caucasian male patient of Dr. Alexis Goodellavid Hardings was referred to me because of renal vascular hypertension. I last saw him in the office 04/15/14. His recovery by his father today. He is currently applying for medical school.He's been treated medically for several years. Recently his blood pressure has been more difficult to control. 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 Otherwise his past medical history is benign. Does have a family history of premature coronary artery disease. CT angiography performed 04/17/14 suggested fibromuscular dysplasia of the right renal artery. 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 I have thoroughly discussed the risks and benefits with the patient and his father who agree to proceed. We'll schedule this sometime next week.   Runell GessJonathan J. Wildon Cuevas, M.D., FACP, [facility]ACC, Earl LagosFAHA, [facility] [address] [place], [place]  [id number]  [phone] 08/27/2014 10:00 AM

## 2014-09-02 NOTE — Interval H&P Note (Signed)
History and Physical Interval Note:  09/02/2014 7:40 AM  William Gilmore  has presented today for surgery, with the diagnosis of renal artery stenosis  The various methods of treatment have been discussed with the patient and family. After consideration of risks, benefits and other options for treatment, the patient has consented to  Procedure(s): RENAL ANGIOGRAM (N/A) as a surgical intervention .  The patient's history has been reviewed, patient examined, no change in status, stable for surgery.  I have reviewed the patient's chart and labs.  Questions were answered to the patient's satisfaction.     Runell GessBERRY,Josemanuel Eakins J

## 2014-09-02 NOTE — Progress Notes (Addendum)
Family in to see.Taking sips of Coke. Denies discomfort. Rt groin level 0.

## 2014-09-02 NOTE — Progress Notes (Signed)
Site area: rt groin Site Prior to Removal:  Level 0 Pressure Applied For: 20 minutes Manual:   yes Patient Status During Pull:  Vageled; nauseated Post Pull Site:  Level 0 Post Pull Instructions Given:  yes Post Pull Pulses Present: yes Dressing Applied:  tegaderm Bedrest begins @ 1010 Comments: nausea gone. VSS.

## 2014-09-02 NOTE — CV Procedure (Signed)
William Gilmore is a 23 y.o. male    [id number] LOCATION:  FACILITY: [facility]  PHYSICIAN: Quay Burow, M.D. 1991-02-11   DATE OF PROCEDURE:  09/02/2014  DATE OF DISCHARGE:     PV Angiogram/Intervention    History obtained from chart review.William Gilmore is a delightful 23 year old fit-appearing single Caucasian male patient of Dr. Liana Crocker was referred to me because of renal vascular hypertension. He is currently applying for medical school.He's been treated medically for several years. Recently his blood pressure has been more difficult to control. 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 Otherwise his past medical history is benign. Does have a family history of premature coronary artery disease. CT angiography performed 04/17/14 suggested fibromuscular dysplasia of the right renal artery. 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 I have thoroughly discussed the risks and benefits with the patient and his father who agree to proceed. William Gilmore presents today for angiography and potential intervention for diagnosis and treatment of presumed renal vascular hypertension secondary to fibromuscular dysplasia.   PROCEDURE DESCRIPTION:   The patient was brought to the second floor [facility] cath lab in the postabsorptive state. He was premedicated with Valium 5 mg by mouth, IV Versed and fentanyl. His right groin was prepped and shaved in usual sterile fashion. Xylocaine 1% was used for local anesthesia. A 6 French sheath was inserted into the right common femoral artery using standard Seldinger technique.a 5 French pigtail catheter was placed at the level of the renal arteries. Abnormal aortography was performed using a pigtail catheter. Visipaque dye was used for  the entirety of the case. Retrograde aortic pressure was monitored during the case. The patient did become vagal when accessing that the right common femoral artery requiring Zofran and 0.5 mg of atropine.  HEMODYNAMICS:    AO SYSTOLIC/AO DIASTOLIC: 607/37 (after he became vagal)   Angiographic Data:   1: Abdominal aortogram-the aorta itself appeared free of atherosclerosis. The left renal artery was normal. The right renal artery had a small septa in its midportion consistent with FMD.  IMPRESSION:abdominal aortogram shows findings consistent with FMD of the right renal artery. We'll proceed with PTA.  Procedure Description:the patient received 9500 units of heparin with an ACT of 202. Total contrast administered to the patient was 140 mL. Using a 6 [place] short JR4 guide catheter along with an 014 stabilizer wire and a 5 mm x 15 mm long balloon initial angioplasty was performed of the area in question/FMD. This appeared to be undersized and I therefore upsized to a 6 mm x 15 mm long balloon which appear to be properly sized. I inflated at 6 atm for 30 seconds. Completion angiography revealed an excellent angiographic result. There is no obvious staining, dissection or perforation. There is excellent flow. The guidewire and catheter were removed and the sheath was secured. The patient left the lab in stable condition.  Final Impression: successful PTA of mid right renal artery FMD in setting of presumed renal vascular hypertension. The sheath will be removed once the ACT falls below 170 impression will be held. The patient will be hydrated overnight and blood work will be obtained the morning. He'll be treated with aspirin. We will get renal Dopplers on him next week and I will see him back the following week prior to him going back to  school. He'll need semiannual renal Doppler studies to evaluate for restenosis.    Lorretta Harp MD, Operating Room Services 09/02/2014 8:55 AM

## 2014-09-03 ENCOUNTER — Other Ambulatory Visit: Payer: Self-pay | Admitting: Physician Assistant

## 2014-09-03 ENCOUNTER — Encounter (HOSPITAL_COMMUNITY): Payer: Self-pay | Admitting: Physician Assistant

## 2014-09-03 DIAGNOSIS — I773 Arterial fibromuscular dysplasia: Secondary | ICD-10-CM

## 2014-09-03 DIAGNOSIS — I701 Atherosclerosis of renal artery: Secondary | ICD-10-CM

## 2014-09-03 DIAGNOSIS — Z8249 Family history of ischemic heart disease and other diseases of the circulatory system: Secondary | ICD-10-CM

## 2014-09-03 DIAGNOSIS — I150 Renovascular hypertension: Secondary | ICD-10-CM | POA: Insufficient documentation

## 2014-09-03 LAB — CBC
HCT: 42.8 % (ref 39.0–52.0)
Hemoglobin: 14.1 g/dL (ref 13.0–17.0)
MCH: 28.8 pg (ref 26.0–34.0)
MCHC: 32.9 g/dL (ref 30.0–36.0)
MCV: 87.5 fL (ref 78.0–100.0)
Platelets: 195 10*3/uL (ref 150–400)
RBC: 4.89 MIL/uL (ref 4.22–5.81)
RDW: 13 % (ref 11.5–15.5)
WBC: 9.1 10*3/uL (ref 4.0–10.5)

## 2014-09-03 LAB — BASIC METABOLIC PANEL
ANION GAP: 4 — AB (ref 5–15)
BUN: 12 mg/dL (ref 6–23)
CALCIUM: 9.1 mg/dL (ref 8.4–10.5)
CO2: 31 mmol/L (ref 19–32)
Chloride: 103 mEq/L (ref 96–112)
Creatinine, Ser: 1.22 mg/dL (ref 0.50–1.35)
GFR calc Af Amer: >90 mL/min (ref >90)
GFR calc non Af Amer: 83 mL/min — ABNORMAL LOW (ref >90)
GLUCOSE: 100 mg/dL — AB (ref 70–99)
Potassium: 4.2 mmol/L (ref 3.5–5.1)
SODIUM: 138 mmol/L (ref 135–145)

## 2014-09-03 LAB — POCT ACTIVATED CLOTTING TIME
ACTIVATED CLOTTING TIME: 214 s
ACTIVATED CLOTTING TIME: 202 s
Activated Clotting Time: 171 seconds

## 2014-09-03 MED ORDER — ASPIRIN 325 MG PO TBEC: 325.0000 mg | DELAYED_RELEASE_TABLET | Freq: Every day | ORAL | Status: AC

## 2014-09-03 NOTE — Discharge Summary (Signed)
Discharge Summary   Patient ID: William Gilmore MRN: [id number], DOB/AGE: [birth year] 23 y.o. Admit date: 09/02/2014 D/C date:     09/03/2014  Primary Cardiologist: Dr. Ellyn Hack and Dr. Gwenlyn Found    Principal Problem:   Renal fibromuscular dysplasia - possible renovascular hypertension Active Problems:   Family history of premature CAD   Admission Dates: 09/02/14-09/03/14 Discharge Diagnosis: Renal fibromuscular dysplasia s/p successful PTA of mid right renal artery   HPI: William Gilmore is a delightful 23 year old fit-appearing single Caucasian male patient of Dr. Liana Crocker was referred to Dr Gwenlyn Found because of renal vascular hypertension. He was seen in the office 04/15/14. He is currently applying for medical school. He's been treated medically for hypertension for several years. Recently his blood pressure has been more difficult to control. 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 Otherwise his past medical history is benign. Does have a family history of premature coronary artery disease. CT angiography performed 04/17/14 suggested fibromuscular dysplasia of the right renal artery. 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   Hospital Course  Renal fibromuscular dysplasia- admitted on 09/02/14 for planned angiography.  -- S/p successful PTA of mid right renal artery  -- BP's much better controlled. 121/64mg HG on last reading. Will discontinue amlodipine  -- Continue ASA  The patient has had an uncomplicated hospital course and is recovering well. The femoral catheter site is stable. He has been seen by Dr. BGwenlyn Foundtoday and deemed ready for discharge home. All follow-up appointments have been scheduled. [facility] and then follow up with a PA on a day Dr. BGwenlyn Foundis in the office before he leaves for college on 09/22/13. Discharge medications are listed below.   Discharge Vitals: Blood pressure 121/52, pulse 60, temperature 97.7 F (36.5 C), temperature source Oral, resp. rate 18, height 5' 10.5" (1.791 m), weight 171 lb 8.3 oz (77.8 kg), SpO2 100 %.  Labs: Lab Results  Component Value Date   WBC 9.1 09/03/2014   HGB 14.1 09/03/2014   HCT 42.8 09/03/2014   MCV 87.5 09/03/2014   PLT 195 09/03/2014     Recent Labs Lab 09/03/14 0405  NA 138  K 4.2  CL 103  CO2 31  BUN 12  CREATININE 1.22  CALCIUM 9.1  GLUCOSE 100*   Diagnostic Studies/Procedures   Dg Chest 2 View  08/27/2014   CLINICAL DATA:  Preoperative evaluation ; history of hypertension; family history of early coronary artery disease  EXAM: CHEST  2 VIEW  COMPARISON:  None.  FINDINGS: The lungs are mildly hyperinflated and clear. The heart and pulmonary vascularity are within the limits of normal. There is no pleural effusion. The mediastinum is normal in width. There is gentle mid thoracic spinal curvature convex towards the right.  IMPRESSION: Mild hyperinflation may be voluntary or could reflect underlying reactive airway disease. There is no acute cardiopulmonary abnormality.   Electronically Signed   By: David  JMartinique  On: 08/27/2014 14:25    PV Angiogram/Intervention  History obtained from chart review.SAnnie Mainis a delightful 23year old fit-appearing single Caucasian male patient of Dr. DLiana Crockerwas referred to me because of renal vascular hypertension. He is currently applying for medical school.He's been treated medically for several years. Recently his blood pressure has  been more difficult to control. 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 Otherwise his past medical history is benign. Does have a family history of premature coronary artery disease. CT angiography performed  04/17/14 suggested fibromuscular dysplasia of the right renal artery. 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 I have thoroughly discussed the risks and benefits with the patient and his father who agree to proceed. William Gilmore presents today for angiography and potential intervention for diagnosis and treatment of presumed renal vascular hypertension secondary to fibromuscular dysplasia.  PROCEDURE DESCRIPTION:  The patient was brought to the second floor [place] [facility]iac cath lab in the postabsorptive state. He was premedicated with Valium 5 mg by mouth, IV Versed and fentanyl. His right groin  was prepped and shaved in usual sterile fashion. Xylocaine 1% was used for local anesthesia. A 6 French sheath was inserted into the right common femoral artery using standard Seldinger technique.a 5 French pigtail catheter was placed at the level of the renal arteries. Abnormal aortography was performed using a pigtail catheter. Visipaque dye was used for the entirety of the case. Retrograde aortic pressure was monitored during the case. The patient did become vagal when accessing that the right common femoral artery requiring Zofran and 0.5 mg of atropine.  HEMODYNAMICS:  AO SYSTOLIC/AO DIASTOLIC: 382/50 (after he became vagal)  Angiographic Data:  1: Abdominal aortogram-the aorta itself appeared free of atherosclerosis. The left renal artery was normal. The right renal artery had a small septa in its midportion consistent with FMD.  IMPRESSION:abdominal aortogram shows findings consistent with FMD of the right renal artery. We'll proceed with PTA.  Procedure Description:the patient received 9500 units of heparin with an ACT of 202. Total contrast administered to the patient was 140 mL. Using a 6 [place] short JR4 guide  catheter along with an 014 stabilizer wire and a 5 mm x 15 mm long balloon initial angioplasty was performed of the area in question/FMD. This appeared to be undersized and I therefore upsized to a 6 mm x 15 mm long balloon which appear to be properly sized. I inflated at 6 atm for 30 seconds. Completion angiography revealed an excellent angiographic result. There is no obvious staining, dissection or perforation. There is excellent flow. The guidewire and catheter were removed and the sheath was secured. The patient left the lab in stable condition.  Final Impression: successful PTA of mid right renal artery FMD in setting of presumed renal vascular hypertension. The sheath will be removed once the ACT falls below 170 impression will be held. The patient will be hydrated overnight and blood work will be obtained the morning. He'll be treated with aspirin. We will get renal Dopplers on him next week and I will see him back the following week prior to him going back to school. He'll need semiannual renal Doppler studies to evaluate for restenosis.       Discharge Medications     Medication List    STOP taking these medications        amLODipine 5 MG tablet  Commonly known as:  NORVASC      TAKE these medications        aspirin 325 MG EC tablet  Take 1 tablet (325 mg total) by mouth daily.        Disposition   The  patient will be discharged in stable condition to home.  Follow-up Information    Follow up with HAGER, BRYAN, PA-C On 09/17/2014.   Specialty:  Physician Assistant   Why:  3:30 pm   Contact information:   [place] [address] [phone]       Follow up with [facility] On 09/10/2014.   Specialty:  Cardiology   Why:  8am. you have to be fasting for this test. Nothing to eat after midnight the night before   Contact information:   [place] [phone]          Duration of Discharge Encounter: Greater than 30 minutes including physician and PA time.  SignedGrandville Silos, KATHRYN R PA-C 09/03/2014, 11:10 AM

## 2014-09-03 NOTE — Progress Notes (Signed)
Right groin level zero ambulated in room no complaints. Patient SB 44-57 with frequent PAC's and  PVC's while sleeping oxygen sats upper 90's RA. While awake NSR heart rate 70-80's.

## 2014-09-03 NOTE — Progress Notes (Signed)
D/c instructions reviewed with pt and family at bedside. Copy of instructions given to pt. Education provided on care of procedure site and restrictions. Pt d/c'd with family with belongings. Pt declined wheelchair, steady gait, ambulated out with family.

## 2014-09-10 ENCOUNTER — Ambulatory Visit (HOSPITAL_COMMUNITY)
Admit: 2014-09-10 | Discharge: 2014-09-10 | Disposition: A | Discharge status: Home / Self Care | Payer: 59 | Attending: Cardiology | Admitting: Cardiology

## 2014-09-10 DIAGNOSIS — I701 Atherosclerosis of renal artery: Secondary | ICD-10-CM | POA: Insufficient documentation

## 2014-09-10 NOTE — Progress Notes (Signed)
Renal Artery Duplex Completed. °Brianna L Mazza,RVT °

## 2014-09-17 ENCOUNTER — Ambulatory Visit: Payer: 59 | Admitting: Physician Assistant

## 2014-09-18 ENCOUNTER — Ambulatory Visit (INDEPENDENT_AMBULATORY_CARE_PROVIDER_SITE_OTHER): Payer: 59 | Admitting: Cardiology

## 2014-09-18 ENCOUNTER — Encounter: Payer: Self-pay | Admitting: Cardiology

## 2014-09-18 VITALS — BP 156/80 | HR 70 | Ht 70.5 in | Wt 171.4 lb

## 2014-09-18 DIAGNOSIS — I150 Renovascular hypertension: Secondary | ICD-10-CM

## 2014-09-18 DIAGNOSIS — I773 Arterial fibromuscular dysplasia: Secondary | ICD-10-CM

## 2014-09-18 DIAGNOSIS — I7789 Other specified disorders of arteries and arterioles: Secondary | ICD-10-CM

## 2014-09-18 DIAGNOSIS — I10 Essential (primary) hypertension: Secondary | ICD-10-CM

## 2014-09-18 LAB — BASIC METABOLIC PANEL
BUN: 25 mg/dL — ABNORMAL HIGH (ref 6–23)
CALCIUM: 9.7 mg/dL (ref 8.4–10.5)
CHLORIDE: 100 meq/L (ref 96–112)
CO2: 27 meq/L (ref 19–32)
CREATININE: 0.92 mg/dL (ref 0.50–1.35)
Glucose, Bld: 85 mg/dL (ref 70–99)
Potassium: 4.3 mEq/L (ref 3.5–5.3)
SODIUM: 138 meq/L (ref 135–145)

## 2014-09-18 MED ORDER — AMLODIPINE BESYLATE 10 MG PO TABS: 10.0000 mg | ORAL_TABLET | Freq: Every day | ORAL | Status: DC

## 2014-09-18 MED ORDER — AMLODIPINE BESYLATE 10 MG PO TABS: 10.0000 mg | ORAL_TABLET | Freq: Every day | ORAL | Status: AC

## 2014-09-18 NOTE — Progress Notes (Signed)
09/18/2014   PCP: Pearson GrippeKIM, JAMES, MD   Chief Complaint  Patient presents with  . Hospitalization Follow-up    pt had angioplasty, no stents was placed    Primary Cardiologist: Dr. Herbie BaltimoreHarding  HPI:  24 year old fit-appearing single Caucasian male patient of Dr. Alexis Goodellavid Hardings was referred to Dr. Erlene QuanJ. Berry  because of renal vascular hypertension. I last saw him in the office 04/15/14.  He is currently applying for medical school.He's been treated medically for several years. Recently his blood pressure has been more difficult to control. 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 Otherwise his past medical history is benign. Does have a family history of premature coronary artery disease. CT angiography performed 04/17/14 suggested fibromuscular dysplasia of the right renal artery. 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   He underwent PTA to Rt renal artery 09/02/14.  This was successful and follow up dopplers with improved velocities.  He is back to see me today post procedure.  He has no complaints, his BP values over last 2 weeks show improved BP though he is on norvasc 5 mg.  He is anxious that he will have increased BP back at school with more caffeine and not resting as well.   No problems with cath site.   No Known Allergies  Current Outpatient Prescriptions  Medication Sig Dispense Refill  . aspirin EC 325 MG EC tablet Take 1 tablet (325 mg total) by mouth daily. 30 tablet 0  . amLODipine (NORVASC) 10 MG tablet Take 1 tablet (10 mg total) by mouth daily. 90 tablet 3   No current facility-administered medications for this visit.    Past Medical History  Diagnosis Date  . Renovascular hypertension   . Renal fibromuscular dysplasia 02/2014    a.  s/p successful PTA of mid right renal artery 09/02/14   . Family history of premature CAD     Father: Severe MV CAD --> CABG    Past Surgical History  Procedure Laterality Date  . Cardiac catheterization  09/02/2014    dr berry  . Renal artery angioplasty  09/02/2014    dr berry  . Renal angiogram N/A 09/02/2014    Procedure: RENAL ANGIOGRAM;  Surgeon: Runell GessJonathan J Berry, MD;  [facility] C[facility];  Service: Cardiovascular;  Laterality: N/A;    XLK:[id number]:OZROS:General:no colds or fevers, no weight changes Skin:no rashes or ulcers HEENT:no blurred vision, no congestion CV:see HPI PUL:see HPI GI:no diarrhea constipation or melena, no indigestion GU:no hematuria, no dysuria MS:no joint pain, no claudication Neuro:no syncope, no lightheadedness Endo:no diabetes, no thyroid disease  Wt Readings from Last 3 Encounters:  09/18/14 171 lb 6.4 oz (77.747 kg)  09/03/14 171 lb 8.3 oz (77.8 kg)  08/27/14 171 lb 14.4 oz (77.973 kg)    PHYSICAL EXAM BP 156/80 mmHg  Pulse 70  Ht 5' 10.5" (1.791 m)  Wt 171 lb 6.4 oz (77.747 kg)  BMI 24.24 kg/m2 General:Pleasant affect, NAD Skin:Warm and dry, brisk capillary refill HEENT:normocephalic, sclera clear, mucus membranes moist Neck:supple, no JVD, soft bruits  Heart:S1S2 RRR without murmur, gallup, rub or click Lungs:clear without rales, rhonchi, or wheezes DGU:YQIHAbd:soft, non tender, + BS, do not palpate liver spleen or masses Ext:no lower ext edema, 2+ pedal pulses, 2+ radial pulses Neuro:alert and oriented,  MAE, follows commands, + facial symmetry EKG:no EKG  ASSESSMENT AND PLAN Renal fibromuscular dysplasia - possible renovascular hypertension Stable, his BP with improved control on amlodipine, though still elevated.  Will increase his amlodipine to 10 mg daily he will call if BP >150/88 or <120/70 and meds will be adjusted.  His renal dopplers were improved.  He will follow up with Dr. Erlene Quan in 6-8 weeks and dopplers every 6 months.  We will check  BMP today.  Renovascular hypertension See above note

## 2014-09-18 NOTE — Assessment & Plan Note (Addendum)
Stable, his BP with improved control on amlodipine, though still elevated.  Will increase his amlodipine to 10 mg daily he will call if BP >150/88 or <120/70 and meds will be adjusted.  His renal dopplers were improved.  He will follow up with Dr. Erlene QuanJ. Berry in 6-8 weeks and dopplers every 6 months.  We will check BMP today.

## 2014-09-18 NOTE — Patient Instructions (Addendum)
Your physician recommends that you schedule a follow-up appointment in: 8-12 Weeks with Dr Allyson SabalBerry  Your physician recommends that you return for lab work [facility]  Your Physician want you to call call our office if Blood Pressure get over 150/88 or below 120/70  Your physician has recommended you make the following change in your medication: Increase Amlodipine to 10 mg daily

## 2014-09-18 NOTE — Assessment & Plan Note (Signed)
See above note

## 2014-11-29 ENCOUNTER — Ambulatory Visit: Payer: Self-pay | Admitting: Cardiovascular Disease

## 2015-03-20 ENCOUNTER — Telehealth (HOSPITAL_COMMUNITY): Payer: Self-pay | Admitting: *Deleted

## 2015-09-13 IMAGING — CT CT CTA ABD/PEL W/CM AND/OR W/O CM
2 of 9 series · 12 of 46 positions shown, 19 images · IV contrast (Omnipaque 300)
Comparison: none

CLINICAL DATA: renal dopplers suggest right renal arterty FMD

EXAM:
CT ANGIOGRAPHY ABDOMEN
TECHNIQUE: Multidetector CT imaging of the abdomen was performed using the
standard protocol during bolus administration of intravenous
contrast. Multiplanar reconstructed images including MIPs were
obtained and reviewed to evaluate the vascular anatomy.
CONTRAST:  100 mL Omnipaque 350 IV

[Series 5: venous 5.0 · axial · portal-venous · 0.67mm/px · z∈[-464,-99]mm · 11 of 89 slices shown, 17 images]
[im 8/89  soft-tissue]
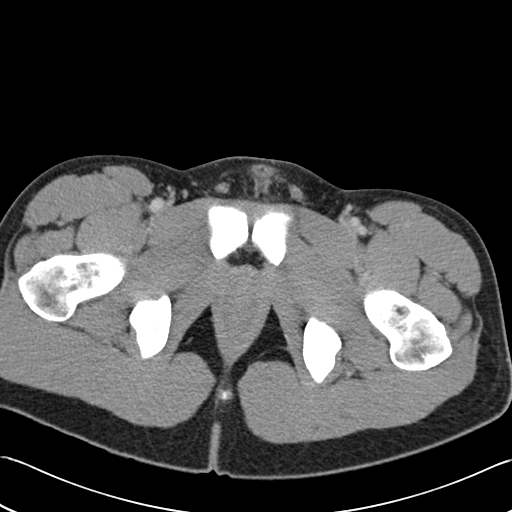
[im 8/89  bone]
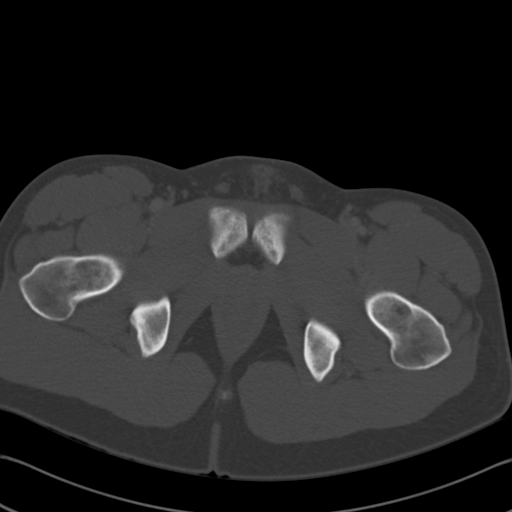
[im 15/89  soft-tissue]
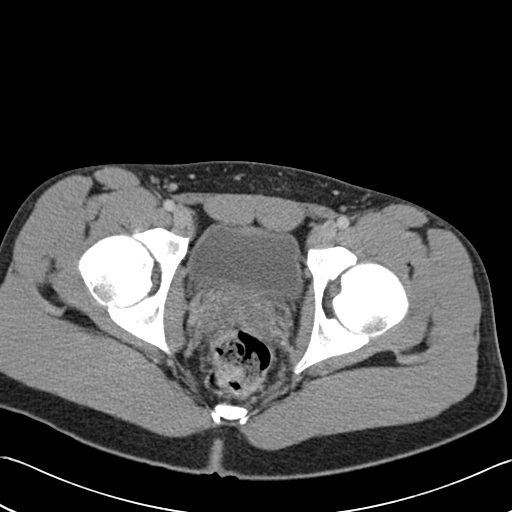
[im 23/89  soft-tissue]
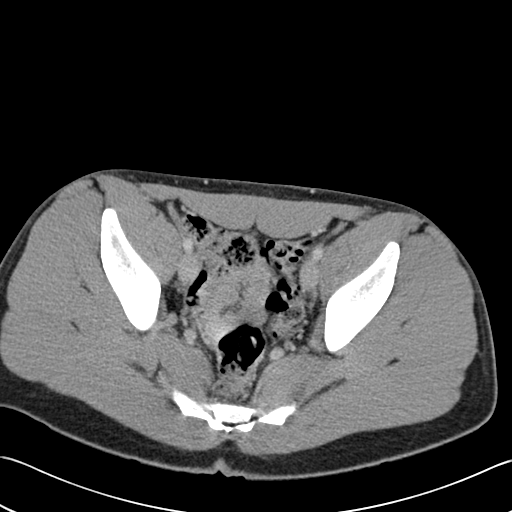
[im 30/89  soft-tissue]
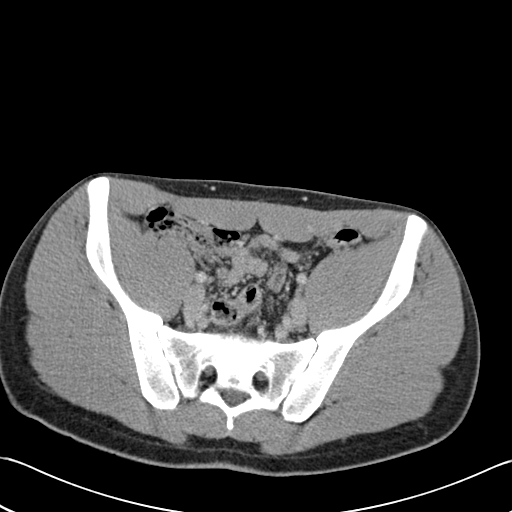
[im 37/89  soft-tissue]
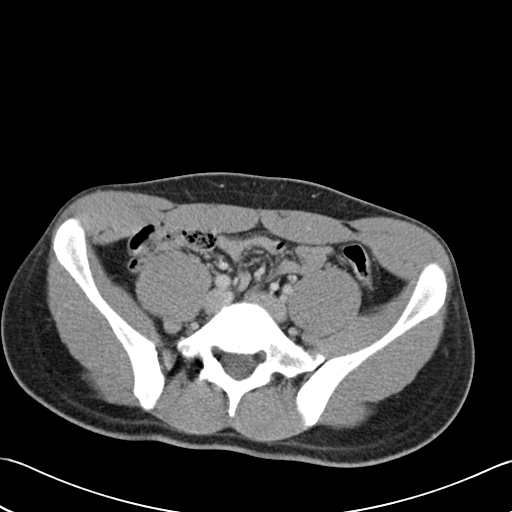
[im 45/89  soft-tissue]
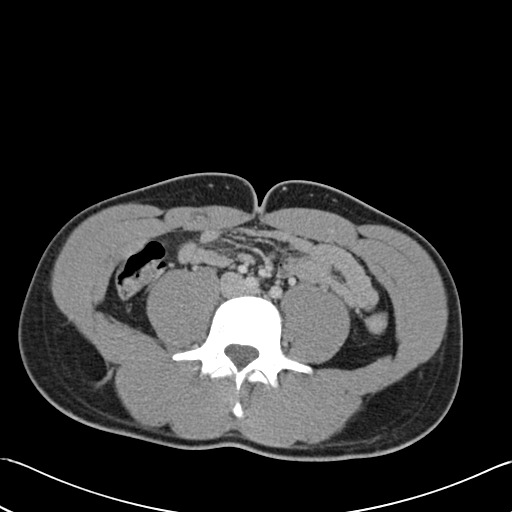
[im 52/89  soft-tissue]
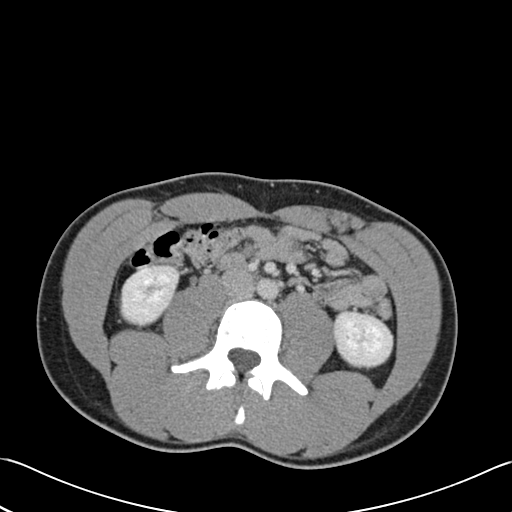
[im 59/89  soft-tissue]
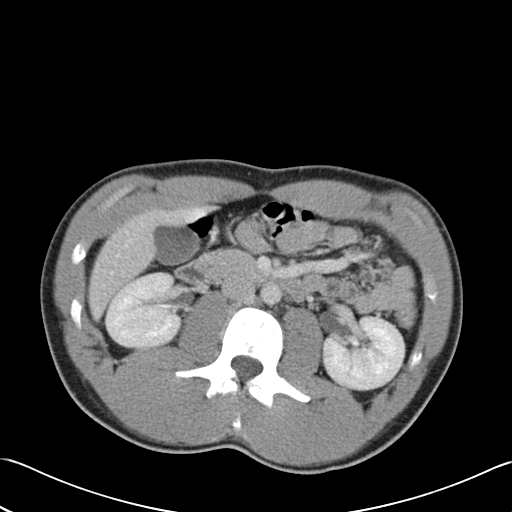
[im 59/89  lung]
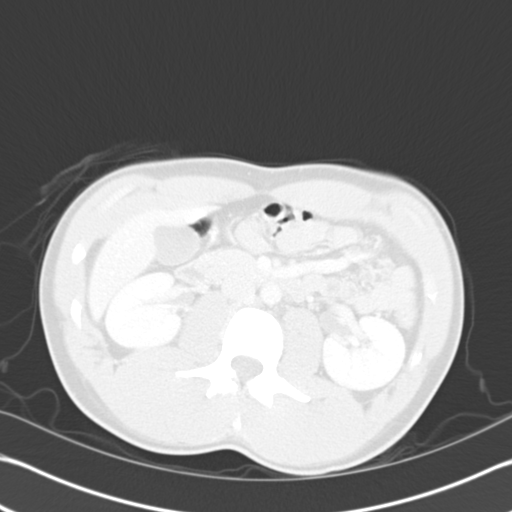
[im 67/89  soft-tissue]
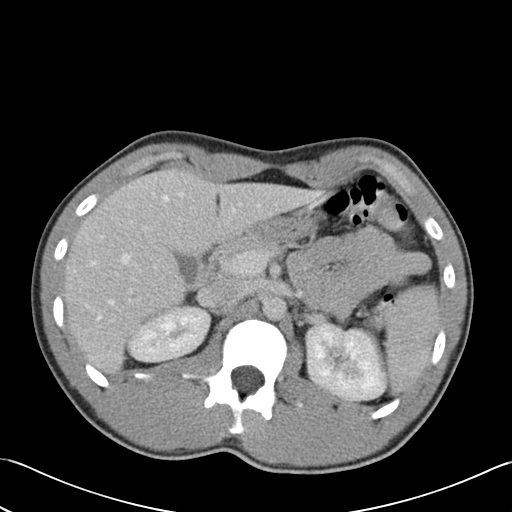
[im 67/89  lung]
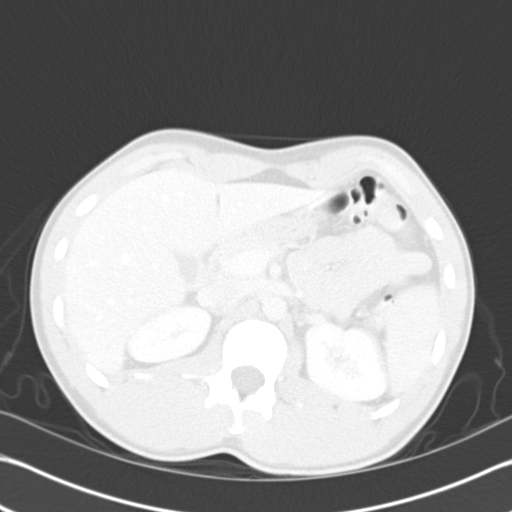
[im 67/89  bone]
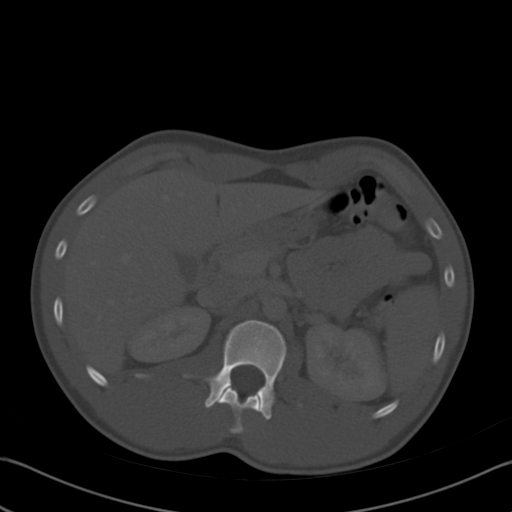
[im 74/89  soft-tissue]
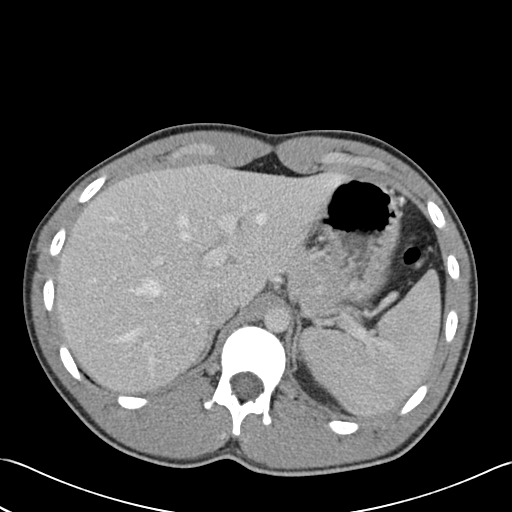
[im 74/89  lung]
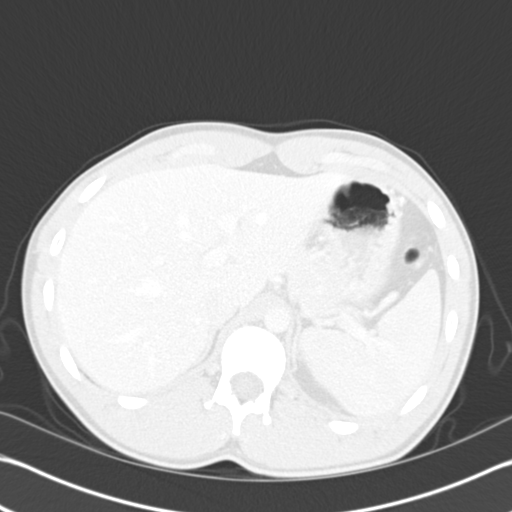
[im 81/89  soft-tissue]
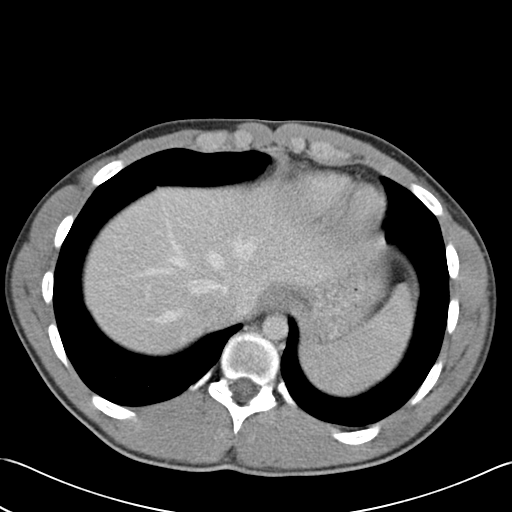
[im 81/89  lung]
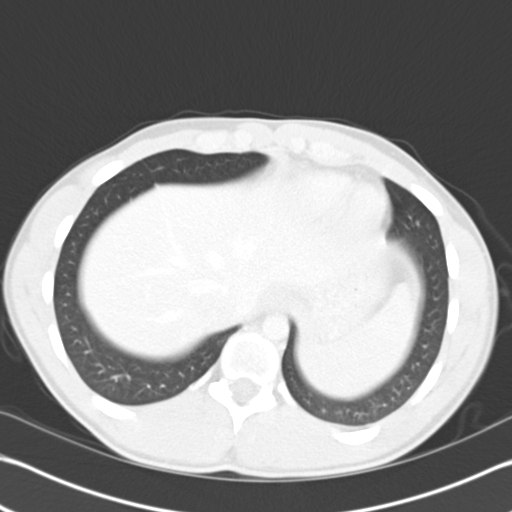

[Series 605: cor mpr · coronal · 0.64mm/px · 1 of 100 slices shown, 2 images]
[im 50/100  soft-tissue]
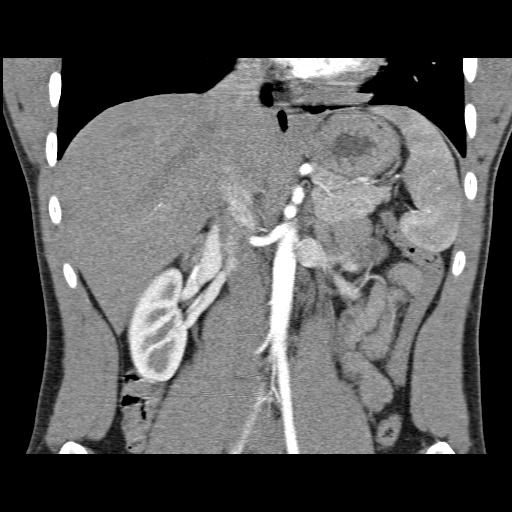
[im 50/100  bone]
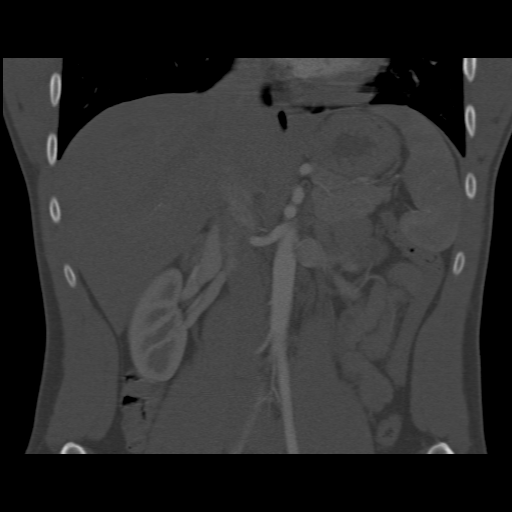

[12 of 46 positions shown; findings below may reference images not displayed]

FINDINGS: ARTERIAL FINDINGS:

Aorta:                Normal

Celiac axis:          Normal

Superior mesenteric:  Normal, classic branch anatomy

Left renal:           Single, normal

Right renal: Single. There is a focal area of mild narrowing in the
mid right renal artery without any definite intraluminal web lumen
or beading. Distal main renal artery and visualized branches
unremarkable.

Inferior mesenteric:  Patent

Iliac arterial system: Unremarkable

Venous findings: Patent hepatic veins, portal vein, superior
mesenteric vein, splenic vein, bilateral renal veins, IVC, and iliac
venous system.

Review of the MIP images confirms the above findings.

Nonvascular findings: Visualized lung bases clear. Unremarkable
liver, nondilated gallbladder, spleen, adrenal glands, kidneys,
pancreas. Normal symmetric bilateral renal parenchymal enhancement.
Stomach, small bowel, and colon are nondilated. Urinary bladder
incompletely distended. No ascites. No free air. No adenopathy
localized.
IMPRESSION: 1. Focal mild narrowing in the mid right renal artery without other
characteristic CT findings of FMD nor convincing evidence of
high-grade stenosis. If clinical suspicion remains high, consider
angiography with pressure measurements.

## 2015-10-29 ENCOUNTER — Other Ambulatory Visit: Payer: Self-pay | Admitting: Cardiology

## 2016-01-23 IMAGING — CR DG CHEST 2V
2 series · 2 of 2 positions shown · non-contrast
Comparison: None.

CLINICAL DATA: Preoperative evaluation ; history of hypertension;
family history of early coronary artery disease

EXAM:
CHEST  2 VIEW

[w chest pa]
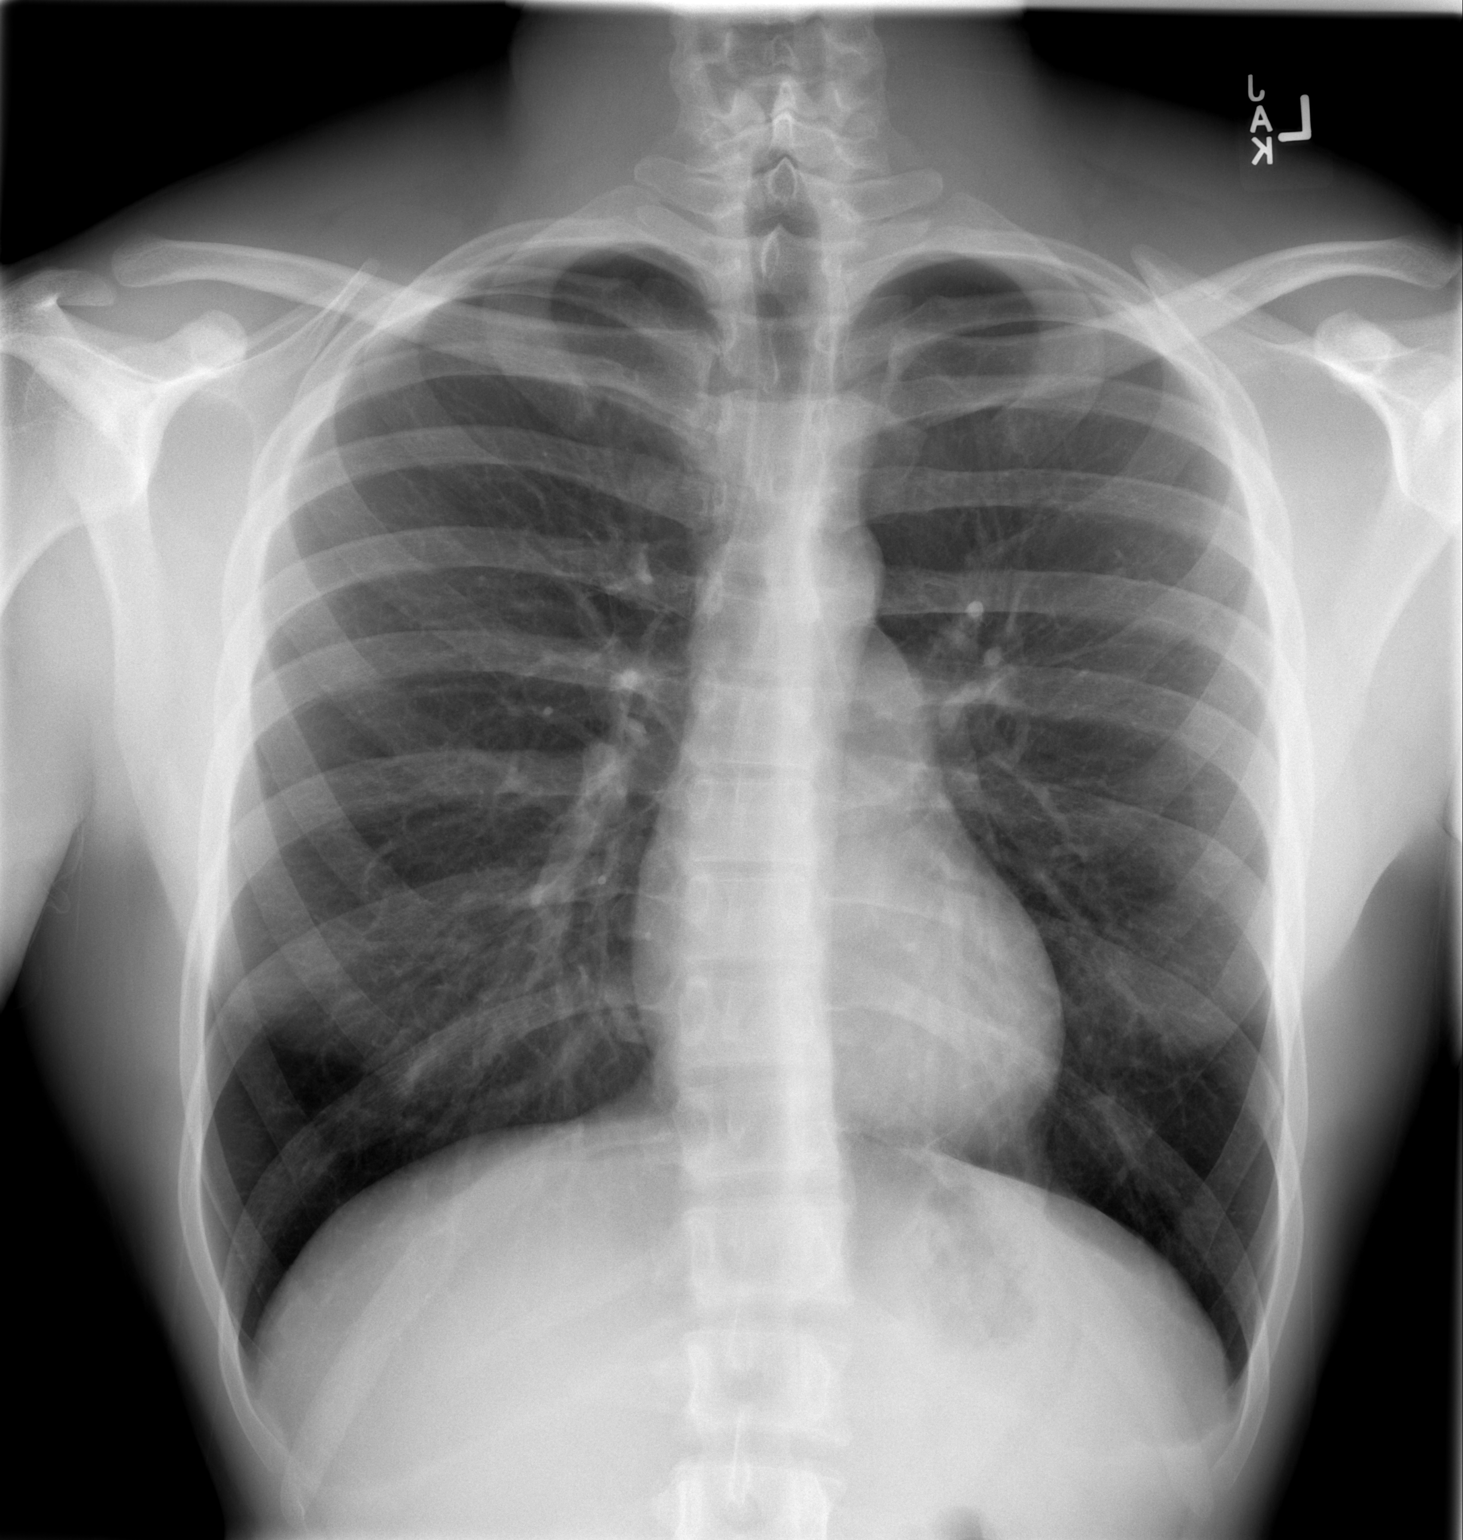

[w chest lat]
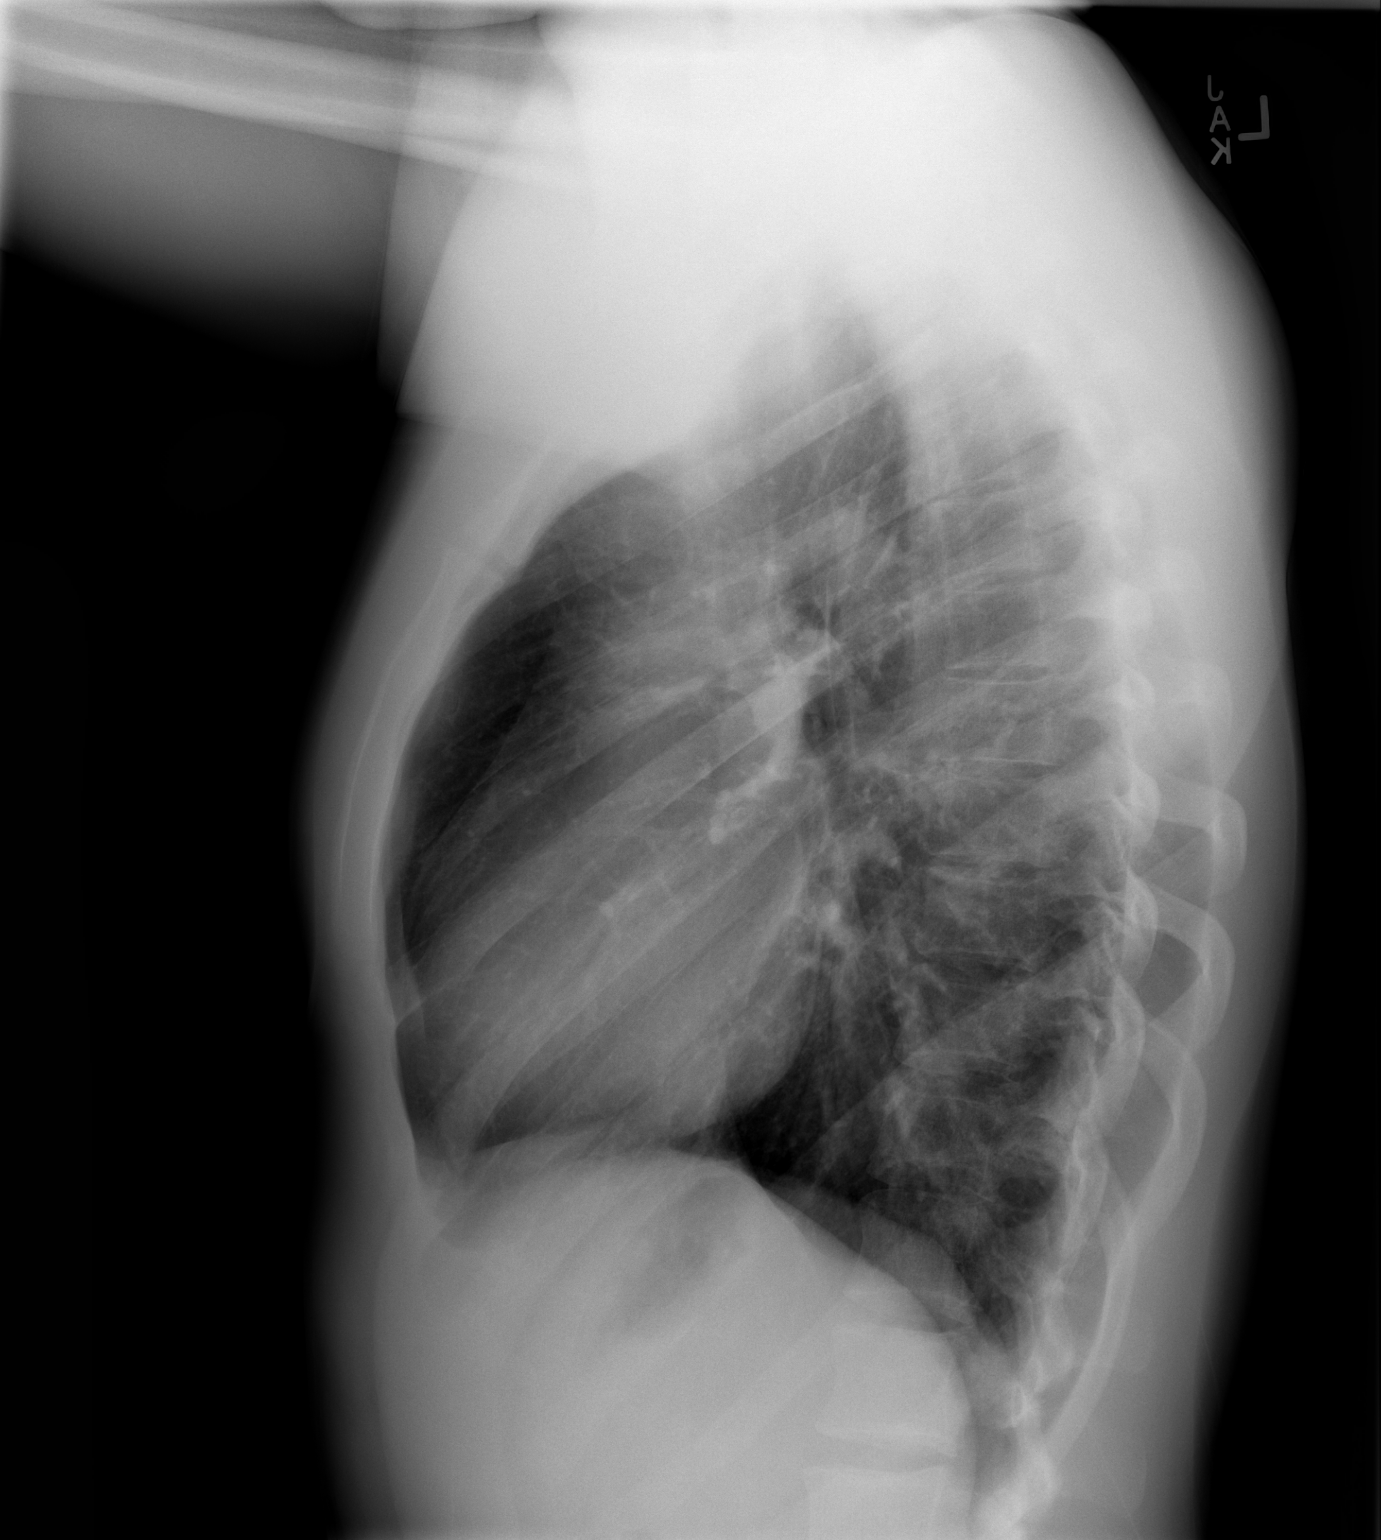

[2 of 2 positions shown; findings below may reference images not displayed]

FINDINGS: The lungs are mildly hyperinflated and clear. The heart and
pulmonary vascularity are within the limits of normal. There is no
pleural effusion. The mediastinum is normal in width. There is
gentle mid thoracic spinal curvature convex towards the right.
IMPRESSION: Mild hyperinflation may be voluntary or could reflect underlying
reactive airway disease. There is no acute cardiopulmonary
abnormality.
